# Patient Record
Sex: Female | Born: 1970 | Race: Asian | Hispanic: No | Smoking: Never smoker
Health system: Southern US, Community
[De-identification: ages and names within clinical notes are randomized; demographics above are authoritative.]

## PROBLEM LIST (undated history)

## (undated) ENCOUNTER — Inpatient Hospital Stay (HOSPITAL_COMMUNITY): Payer: Self-pay

## (undated) DIAGNOSIS — O09529 Supervision of elderly multigravida, unspecified trimester: Secondary | ICD-10-CM

## (undated) DIAGNOSIS — L309 Dermatitis, unspecified: Secondary | ICD-10-CM

## (undated) HISTORY — DX: Dermatitis, unspecified: L30.9

## (undated) HISTORY — PX: NO PAST SURGERIES: SHX2092

## (undated) HISTORY — DX: Supervision of elderly multigravida, unspecified trimester: O09.529

---

## 1997-08-01 ENCOUNTER — Inpatient Hospital Stay (HOSPITAL_COMMUNITY): Admission: AD | Admit: 1997-08-01 | Discharge: 1997-08-01 | Payer: Self-pay | Admitting: Obstetrics and Gynecology

## 1997-08-08 ENCOUNTER — Inpatient Hospital Stay (HOSPITAL_COMMUNITY): Admission: AD | Admit: 1997-08-08 | Discharge: 1997-08-10 | Payer: Self-pay | Admitting: Obstetrics & Gynecology

## 1998-09-23 ENCOUNTER — Other Ambulatory Visit: Admission: RE | Admit: 1998-09-23 | Discharge: 1998-09-23 | Payer: Self-pay | Admitting: Obstetrics and Gynecology

## 2000-02-24 ENCOUNTER — Other Ambulatory Visit: Admission: RE | Admit: 2000-02-24 | Discharge: 2000-02-24 | Payer: Self-pay | Admitting: Obstetrics and Gynecology

## 2000-03-25 ENCOUNTER — Other Ambulatory Visit: Admission: RE | Admit: 2000-03-25 | Discharge: 2000-03-25 | Payer: Self-pay | Admitting: Obstetrics and Gynecology

## 2000-03-25 ENCOUNTER — Encounter (INDEPENDENT_AMBULATORY_CARE_PROVIDER_SITE_OTHER): Payer: Self-pay | Admitting: Specialist

## 2000-09-20 ENCOUNTER — Other Ambulatory Visit: Admission: RE | Admit: 2000-09-20 | Discharge: 2000-09-20 | Payer: Self-pay | Admitting: Obstetrics and Gynecology

## 2001-05-13 ENCOUNTER — Other Ambulatory Visit: Admission: RE | Admit: 2001-05-13 | Discharge: 2001-05-13 | Payer: Self-pay | Admitting: Obstetrics and Gynecology

## 2002-08-21 ENCOUNTER — Other Ambulatory Visit: Admission: RE | Admit: 2002-08-21 | Discharge: 2002-08-21 | Payer: Self-pay | Admitting: Obstetrics and Gynecology

## 2003-03-06 ENCOUNTER — Other Ambulatory Visit: Admission: RE | Admit: 2003-03-06 | Discharge: 2003-03-06 | Payer: Self-pay | Admitting: Obstetrics and Gynecology

## 2003-09-05 ENCOUNTER — Inpatient Hospital Stay (HOSPITAL_COMMUNITY): Admission: AD | Admit: 2003-09-05 | Discharge: 2003-09-07 | Payer: Self-pay | Admitting: Obstetrics and Gynecology

## 2003-10-10 ENCOUNTER — Other Ambulatory Visit: Admission: RE | Admit: 2003-10-10 | Discharge: 2003-10-10 | Payer: Self-pay | Admitting: Obstetrics and Gynecology

## 2005-09-23 ENCOUNTER — Other Ambulatory Visit: Admission: RE | Admit: 2005-09-23 | Discharge: 2005-09-23 | Payer: Self-pay | Admitting: Obstetrics and Gynecology

## 2006-10-29 ENCOUNTER — Other Ambulatory Visit: Admission: RE | Admit: 2006-10-29 | Discharge: 2006-10-29 | Payer: Self-pay | Admitting: Family Medicine

## 2007-11-10 ENCOUNTER — Other Ambulatory Visit: Admission: RE | Admit: 2007-11-10 | Discharge: 2007-11-10 | Payer: Self-pay | Admitting: Family Medicine

## 2008-11-21 ENCOUNTER — Other Ambulatory Visit: Admission: RE | Admit: 2008-11-21 | Discharge: 2008-11-21 | Payer: Self-pay | Admitting: Family Medicine

## 2010-04-16 ENCOUNTER — Other Ambulatory Visit: Admission: RE | Admit: 2010-04-16 | Discharge: 2010-04-16 | Payer: Self-pay | Admitting: Family Medicine

## 2011-10-21 ENCOUNTER — Other Ambulatory Visit (HOSPITAL_COMMUNITY)
Admission: RE | Admit: 2011-10-21 | Discharge: 2011-10-21 | Disposition: A | Discharge status: Home / Self Care | Payer: BC Managed Care – PPO | Source: Ambulatory Visit | Attending: Family Medicine | Admitting: Family Medicine

## 2011-10-21 ENCOUNTER — Other Ambulatory Visit: Payer: Self-pay | Admitting: Family Medicine

## 2011-10-21 DIAGNOSIS — Z1159 Encounter for screening for other viral diseases: Secondary | ICD-10-CM | POA: Insufficient documentation

## 2011-10-21 DIAGNOSIS — Z124 Encounter for screening for malignant neoplasm of cervix: Secondary | ICD-10-CM | POA: Insufficient documentation

## 2012-07-05 ENCOUNTER — Encounter: Payer: Self-pay | Admitting: General Practice

## 2012-07-11 ENCOUNTER — Encounter: Payer: Self-pay | Admitting: *Deleted

## 2012-07-26 ENCOUNTER — Inpatient Hospital Stay (HOSPITAL_COMMUNITY): Payer: BC Managed Care – PPO

## 2012-07-26 ENCOUNTER — Inpatient Hospital Stay (HOSPITAL_COMMUNITY)
Admission: AD | Admit: 2012-07-26 | Discharge: 2012-07-26 | Disposition: A | Discharge status: Home / Self Care | Payer: BC Managed Care – PPO | Source: Ambulatory Visit | Attending: Obstetrics & Gynecology | Admitting: Obstetrics & Gynecology

## 2012-07-26 ENCOUNTER — Encounter (HOSPITAL_COMMUNITY): Payer: Self-pay

## 2012-07-26 DIAGNOSIS — O209 Hemorrhage in early pregnancy, unspecified: Secondary | ICD-10-CM | POA: Insufficient documentation

## 2012-07-26 DIAGNOSIS — N930 Postcoital and contact bleeding: Secondary | ICD-10-CM

## 2012-07-26 DIAGNOSIS — O4690 Antepartum hemorrhage, unspecified, unspecified trimester: Secondary | ICD-10-CM

## 2012-07-26 LAB — ABO/RH: ABO/RH(D): A POS

## 2012-07-26 LAB — WET PREP, GENITAL
Trich, Wet Prep: NONE SEEN
Yeast Wet Prep HPF POC: NONE SEEN

## 2012-07-26 LAB — CBC
HCT: 32.9 % — ABNORMAL LOW (ref 36.0–46.0)
Hemoglobin: 11.6 g/dL — ABNORMAL LOW (ref 12.0–15.0)
MCV: 76 fL — ABNORMAL LOW (ref 78.0–100.0)
WBC: 9.4 10*3/uL (ref 4.0–10.5)

## 2012-07-26 NOTE — MAU Provider Note (Signed)
Attestation of Attending Supervision of Advanced Practitioner (CNM/NP): Evaluation and management procedures were performed by the Advanced Practitioner under my supervision and collaboration.  I have reviewed the Advanced Practitioner's note and chart, and I agree with the management and plan.  [name], [name] 3:05 PM

## 2012-07-26 NOTE — MAU Note (Signed)
Patient is in with c/o vaginal bleeding that increases after intercourse. She denies any pain. Her ist OB appt is 08/03/12. fht 165-172. She is unsure of her LMP.

## 2012-07-26 NOTE — MAU Provider Note (Signed)
History     CSN: [id number]  Arrival date and time: 07/26/12 6295   None     Chief Complaint  Patient presents with  . Vaginal Bleeding   HPI Jordan Cabrera is a 42 y.o. female who presents to MAU with vaginal bleeding. The bleeding started last night about 11 pm. She describes the bleeding as lighter than a period. Noted the bleeding just after sexual intercourse. Then noted once more after going to the toilet. None since then. She denies any other problems.  First prenatal visit is scheduled for 08/03/12. The history was provided by the patient.  OB History   Grav Para Term Preterm Abortions TAB SAB Ect Mult Living   3 3 3   0 0 0 0 0 3      Past Medical History  Diagnosis Date  . Eczema     No past surgical history on file.  No family history on file.  History  Substance Use Topics  . Smoking status: Not on file  . Smokeless tobacco: Not on file  . Alcohol Use:     Allergies: Allergies no known allergies  No prescriptions prior to admission    Review of Systems  Constitutional: Negative for fever, chills and weight loss.  HENT: Negative for ear pain, nosebleeds, congestion, sore throat and neck pain.   Eyes: Negative for blurred vision, double vision, photophobia and pain.  Respiratory: Negative for cough, shortness of breath and wheezing.   Cardiovascular: Negative for chest pain, palpitations and leg swelling.  Gastrointestinal: Positive for nausea. Negative for heartburn, vomiting, abdominal pain, diarrhea and constipation.  Genitourinary: Negative for dysuria, urgency and frequency.       Vaginal bleeding  Musculoskeletal: Negative for myalgias and back pain.  Skin: Negative for itching and rash.  Neurological: Negative for dizziness, sensory change, speech change, seizures, weakness and headaches.  Endo/Heme/Allergies: Does not bruise/bleed easily.  Psychiatric/Behavioral: Negative for depression. The patient is not nervous/anxious.    Blood pressure 119/70,  pulse 77, temperature 98 F (36.7 C), temperature source Oral, resp. rate 16, height 5\' 4"  (1.626 m), weight 127 lb (57.607 kg).  Physical Exam  Nursing note and vitals reviewed. Constitutional: She is oriented to person, place, and time. She appears well-developed and well-nourished. No distress.  HENT:  Head: Normocephalic and atraumatic.  Eyes: EOM are normal.  Neck: Neck supple.  Cardiovascular: Normal rate.   Respiratory: Effort normal.  GI: Soft. There is no tenderness.  Genitourinary:  External genitalia without lesions. Scant brown discharge vaginal vault. No active bleeding noted. Cervix long, closed, no CMT, no adnexal tenderness. Uterus approximately 10 week size.   Musculoskeletal: Normal range of motion.  Neurological: She is alert and oriented to person, place, and time.  Skin: Skin is warm and dry.  Psychiatric: She has a normal mood and affect. Her behavior is normal. Judgment and thought content normal.   Results for orders placed during the hospital encounter of 07/26/12 (from the past 24 hour(s))  WET PREP, GENITAL     Status: Abnormal   Collection Time    07/26/12 10:36 AM      Result Value Range   Yeast Wet Prep HPF POC NONE SEEN  NONE SEEN   Trich, Wet Prep NONE SEEN  NONE SEEN   Clue Cells Wet Prep HPF POC NONE SEEN  NONE SEEN   WBC, Wet Prep HPF POC FEW (*) NONE SEEN  ABO/RH     Status: None   Collection Time  07/26/12 10:40 AM      Result Value Range   ABO/RH(D) A POS    CBC     Status: Abnormal   Collection Time    07/26/12 10:40 AM      Result Value Range   WBC 9.4  4.0 - 10.5 K/uL   RBC 4.33  3.87 - 5.11 MIL/uL   Hemoglobin 11.6 (*) 12.0 - 15.0 g/dL   HCT 16.1 (*) 09.6 - 04.5 %   MCV 76.0 (*) 78.0 - 100.0 fL   MCH 26.8  26.0 - 34.0 pg   MCHC 35.3  30.0 - 36.0 g/dL   RDW 40.9  81.1 - 91.4 %   Platelets 291  150 - 400 K/uL    Procedures   Assessment: 42 y.o. female @ 10 weeks 2 days gestation with vaginal bleeding   Postcoital  bleeding  Plan:  Keep appointment next week to start prenatal care.   Pelvic rest, Return here as needed I have reviewed this patient's vital signs, nurses notes, appropriate labs and imaging.  I have discussed results with the patient and plan of care. Patient voices understanding.    Medication List    TAKE these medications       prenatal multivitamin Tabs  Take 1 tablet by mouth daily.         Roshaun Pound, RN, FNP, [facility] 07/26/2012, 10:10 AM

## 2012-08-03 ENCOUNTER — Encounter: Payer: Self-pay | Admitting: Obstetrics and Gynecology

## 2012-08-03 ENCOUNTER — Ambulatory Visit (INDEPENDENT_AMBULATORY_CARE_PROVIDER_SITE_OTHER): Payer: BC Managed Care – PPO | Admitting: Obstetrics and Gynecology

## 2012-08-03 VITALS — BP 114/72 | Temp 98.4°F | Wt 125.8 lb

## 2012-08-03 DIAGNOSIS — O09529 Supervision of elderly multigravida, unspecified trimester: Secondary | ICD-10-CM

## 2012-08-03 DIAGNOSIS — O09892 Supervision of other high risk pregnancies, second trimester: Secondary | ICD-10-CM

## 2012-08-03 DIAGNOSIS — O09899 Supervision of other high risk pregnancies, unspecified trimester: Secondary | ICD-10-CM | POA: Insufficient documentation

## 2012-08-03 DIAGNOSIS — O09522 Supervision of elderly multigravida, second trimester: Secondary | ICD-10-CM

## 2012-08-03 DIAGNOSIS — O09219 Supervision of pregnancy with history of pre-term labor, unspecified trimester: Secondary | ICD-10-CM

## 2012-08-03 LAB — POCT URINALYSIS DIP (DEVICE)
Bilirubin Urine: NEGATIVE
Ketones, ur: NEGATIVE mg/dL
Specific Gravity, Urine: <=1.005 (ref 1.005–1.030)
pH: 5.5 (ref 5.0–8.0)

## 2012-08-03 MED ORDER — DOXYLAMINE-PYRIDOXINE 10-10 MG PO TBEC: 1.0000 | DELAYED_RELEASE_TABLET | Freq: Two times a day (BID) | ORAL | Status: DC

## 2012-08-03 NOTE — Progress Notes (Signed)
Pulse- 87 Patient reports maybe feeling a slight "flutter" from baby

## 2012-08-03 NOTE — Patient Instructions (Addendum)
ABCs of Pregnancy  A  Antepartum care is very important. Be sure you see your doctor and get prenatal care as soon as you think you are pregnant. At this time, you will be tested for infection, genetic abnormalities and potential problems with you and the pregnancy. This is the time to discuss diet, exercise, work, medications, labor, pain medication during labor and the possibility of a cesarean delivery. Ask any questions that may concern you. It is important to see your doctor regularly throughout your pregnancy. Avoid exposure to toxic substances and chemicals - such as cleaning solvents, lead and mercury, some insecticides, and paint. Pregnant women should avoid exposure to paint fumes, and fumes that cause you to feel ill, dizzy or faint. When possible, it is a good idea to have a pre-pregnancy consultation with your caregiver to begin some important recommendations your caregiver suggests such as, taking folic acid, exercising, quitting smoking, avoiding alcoholic beverages, etc.  B  Breastfeeding is the healthiest choice for both you and your baby. It has many nutritional benefits for the baby and health benefits for the mother. It also creates a very tight and loving bond between the baby and mother. Talk to your doctor, your family and friends, and your employer about how you choose to feed your baby and how they can support you in your decision. Not all birth defects can be prevented, but a woman can take actions that may increase her chance of having a healthy baby. Many birth defects happen very early in pregnancy, sometimes before a woman even knows she is pregnant. Birth defects or abnormalities of any child in your or the father's family should be discussed with your caregiver. Get a good support bra as your breast size changes. Wear it especially when you exercise and when nursing.   C  Celebrate the news of your pregnancy with the your spouse/father and family. Childbirth classes are helpful to  take for you and the spouse/father because it helps to understand what happens during the pregnancy, labor and delivery. Cesarean delivery should be discussed with your doctor so you are prepared for that possibility. The pros and cons of circumcision if it is a boy, should be discussed with your pediatrician. Cigarette smoking during pregnancy can result in low birth weight babies. It has been associated with infertility, miscarriages, tubal pregnancies, infant death (mortality) and poor health (morbidity) in childhood. Additionally, cigarette smoking may cause long-term learning disabilities. If you smoke, you should try to quit before getting pregnant and not smoke during the pregnancy. Secondary smoke may also harm a mother and her developing baby. It is a good idea to ask people to stop smoking around you during your pregnancy and after the baby is born. Extra calcium is necessary when you are pregnant and is found in your prenatal vitamin, in dairy products, green leafy vegetables and in calcium supplements.  D  A healthy diet according to your current weight and height, along with vitamins and mineral supplements should be discussed with your caregiver. Domestic abuse or violence should be made known to your doctor right away to get the situation corrected. Drink more water when you exercise to keep hydrated. Discomfort of your back and legs usually develops and progresses from the middle of the second trimester through to delivery of the baby. This is because of the enlarging baby and uterus, which may also affect your balance. Do not take illegal drugs. Illegal drugs can seriously harm the baby and you. Drink extra   fluids (water is best) throughout pregnancy to help your body keep up with the increases in your blood volume. Drink at least 6 to 8 glasses of water, fruit juice, or milk each day. A good way to know you are drinking enough fluid is when your urine looks almost like clear water or is very light  yellow.   E  Eat healthy to get the nutrients you and your unborn baby need. Your meals should include the five basic food groups. Exercise (30 minutes of light to moderate exercise a day) is important and encouraged during pregnancy, if there are no medical problems or problems with the pregnancy. Exercise that causes discomfort or dizziness should be stopped and reported to your caregiver. Emotions during pregnancy can change from being ecstatic to depression and should be understood by you, your partner and your family.  F  Fetal screening with ultrasound, amniocentesis and monitoring during pregnancy and labor is common and sometimes necessary. Take 400 micrograms of folic acid daily both before, when possible, and during the first few months of pregnancy to reduce the risk of birth defects of the brain and spine. All women who could possibly become pregnant should take a vitamin with folic acid, every day. It is also important to eat a healthy diet with fortified foods (enriched grain products, including cereals, rice, breads, and pastas) and foods with natural sources of folate (orange juice, green leafy vegetables, beans, peanuts, broccoli, asparagus, peas, and lentils). The father should be involved with all aspects of the pregnancy including, the prenatal care, childbirth classes, labor, delivery, and postpartum time. Fathers may also have emotional concerns about being a father, financial needs, and raising a family.  G  Genetic testing should be done appropriately. It is important to know your family and the father's history. If there have been problems with pregnancies or birth defects in your family, report these to your doctor. Also, genetic counselors can talk with you about the information you might need in making decisions about having a family. You can call a major medical center in your area for help in finding a board-certified genetic counselor. Genetic testing and counseling should be done  before pregnancy when possible, especially if there is a history of problems in the mother's or father's family. Certain ethnic backgrounds are more at risk for genetic defects.  H  Get familiar with the hospital where you will be having your baby. Get to know how long it takes to get there, the labor and delivery area, and the hospital procedures. Be sure your medical insurance is accepted there. Get your home ready for the baby including, clothes, the baby's room (when possible), furniture and car seat. Hand washing is important throughout the day, especially after handling raw meat and poultry, changing the baby's diaper or using the bathroom. This can help prevent the spread of many bacteria and viruses that cause infection. Your hair may become dry and thinner, but will return to normal a few weeks after the baby is born. Heartburn is a common problem that can be treated by taking antacids recommended by your caregiver, eating smaller meals 5 or 6 times a day, not drinking liquids when eating, drinking between meals and raising the head of your bed 2 to 3 inches.  I  Insurance to cover you, the baby, doctor and hospital should be reviewed so that you will be prepared to pay any costs not covered by your insurance plan. If you do not have medical insurance,   there are usually clinics and services available for you in your community. Take 30 milligrams of iron during your pregnancy as prescribed by your doctor to reduce the risk of low red blood cells (anemia) later in pregnancy. All women of childbearing age should eat a diet rich in iron.  J  There should be a joint effort for the mother, father and any other children to adapt to the pregnancy financially, emotionally, and psychologically during the pregnancy. Join a support group for moms-to-be. Or, join a class on parenting or childbirth. Have the family participate when possible.  K  Know your limits. Let your caregiver know if you experience any of the  following:   · Pain of any kind.  · Strong cramps.  · You develop a lot of weight in a short period of time (5 pounds in 3 to 5 days).  · Vaginal bleeding, leaking of amniotic fluid.  · Headache, vision problems.  · Dizziness, fainting, shortness of breath.  · Chest pain.  · Fever of 102° F (38.9° C) or higher.  · Gush of clear fluid from your vagina.  · Painful urination.  · Domestic violence.  · Irregular heartbeat (palpitations).  · Rapid beating of the heart (tachycardia).  · Constant feeling sick to your stomach (nauseous) and vomiting.  · Trouble walking, fluid retention (edema).  · Muscle weakness.  · If your baby has decreased activity.  · Persistent diarrhea.  · Abnormal vaginal discharge.  · Uterine contractions at 20-minute intervals.  · Back pain that travels down your leg.  L  Learn and practice that what you eat and drink should be in moderation and healthy for you and your baby. Legal drugs such as alcohol and caffeine are important issues for pregnant women. There is no safe amount of alcohol a woman can drink while pregnant. Fetal alcohol syndrome, a disorder characterized by growth retardation, facial abnormalities, and central nervous system dysfunction, is caused by a woman's use of alcohol during pregnancy. Caffeine, found in tea, coffee, soft drinks and chocolate, should also be limited. Be sure to read labels when trying to cut down on caffeine during pregnancy. More than 200 foods, beverages, and over-the-counter medications contain caffeine and have a high salt content! There are coffees and teas that do not contain caffeine.  M  Medical conditions such as diabetes, epilepsy, and high blood pressure should be treated and kept under control before pregnancy when possible, but especially during pregnancy. Ask your caregiver about any medications that may need to be changed or adjusted during pregnancy. If you are currently taking any medications, ask your caregiver if it is safe to take them  while you are pregnant or before getting pregnant when possible. Also, be sure to discuss any herbs or vitamins you are taking. They are medicines, too! Discuss with your doctor all medications, prescribed and over-the-counter, that you are taking. During your prenatal visit, discuss the medications your doctor may give you during labor and delivery.  N  Never be afraid to ask your doctor or caregiver questions about your health, the progress of the pregnancy, family problems, stressful situations, and recommendation for a pediatrician, if you do not have one. It is better to take all precautions and discuss any questions or concerns you may have during your office visits. It is a good idea to write down your questions before you visit the doctor.  O  Over-the-counter cough and cold remedies may contain alcohol or other ingredients that should   be avoided during pregnancy. Ask your caregiver about prescription, herbs or over-the-counter medications that you are taking or may consider taking while pregnant.   P  Physical activity during pregnancy can benefit both you and your baby by lessening discomfort and fatigue, providing a sense of well-being, and increasing the likelihood of early recovery after delivery. Light to moderate exercise during pregnancy strengthens the belly (abdominal) and back muscles. This helps improve posture. Practicing yoga, walking, swimming, and cycling on a stationary bicycle are usually safe exercises for pregnant women. Avoid scuba diving, exercise at high altitudes (over 3000 feet), skiing, horseback riding, contact sports, etc. Always check with your doctor before beginning any kind of exercise, especially during pregnancy and especially if you did not exercise before getting pregnant.  Q  Queasiness, stomach upset and morning sickness are common during pregnancy. Eating a couple of crackers or dry toast before getting out of bed. Foods that you normally love may make you feel sick to  your stomach. You may need to substitute other nutritious foods. Eating 5 or 6 small meals a day instead of 3 large ones may make you feel better. Do not drink with your meals, drink between meals. Questions that you have should be written down and asked during your prenatal visits.  R  Read about and make plans to baby-proof your home. There are important tips for making your home a safer environment for your baby. Review the tips and make your home safer for you and your baby. Read food labels regarding calories, salt and fat content in the food.  S  Saunas, hot tubs, and steam rooms should be avoided while you are pregnant. Excessive high heat may be harmful during your pregnancy. Your caregiver will screen and examine you for sexually transmitted diseases and genetic disorders during your prenatal visits. Learn the signs of labor. Sexual relations while pregnant is safe unless there is a medical or pregnancy problem and your caregiver advises against it.  T  Traveling long distances should be avoided especially in the third trimester of your pregnancy. If you do have to travel out of state, be sure to take a copy of your medical records and medical insurance plan with you. You should not travel long distances without seeing your doctor first. Most airlines will not allow you to travel after 36 weeks of pregnancy. Toxoplasmosis is an infection caused by a parasite that can seriously harm an unborn baby. Avoid eating undercooked meat and handling cat litter. Be sure to wear gloves when gardening. Tingling of the hands and fingers is not unusual and is due to fluid retention. This will go away after the baby is born.  U  Womb (uterus) size increases during the first trimester. Your kidneys will begin to function more efficiently. This may cause you to feel the need to urinate more often. You may also leak urine when sneezing, coughing or laughing. This is due to the growing uterus pressing against your bladder,  which lies directly in front of and slightly under the uterus during the first few months of pregnancy. If you experience burning along with frequency of urination or bloody urine, be sure to tell your doctor. The size of your uterus in the third trimester may cause a problem with your balance. It is advisable to maintain good posture and avoid wearing high heels during this time. An ultrasound of your baby may be necessary during your pregnancy and is safe for you and your baby.  V    Vaccinations are an important concern for pregnant women. Get needed vaccines before pregnancy. Center for Disease Control (www.cdc.gov) has clear guidelines for the use of vaccines during pregnancy. Review the list, be sure to discuss it with your doctor. Prenatal vitamins are helpful and healthy for you and the baby. Do not take extra vitamins except what is recommended. Taking too much of certain vitamins can cause overdose problems. Continuous vomiting should be reported to your caregiver. Varicose veins may appear especially if there is a family history of varicose veins. They should subside after the delivery of the baby. Support hose helps if there is leg discomfort.  W  Being overweight or underweight during pregnancy may cause problems. Try to get within 15 pounds of your ideal weight before pregnancy. Remember, pregnancy is not a time to be dieting! Do not stop eating or start skipping meals as your weight increases. Both you and your baby need the calories and nutrition you receive from a healthy diet. Be sure to consult with your doctor about your diet. There is a formula and diet plan available depending on whether you are overweight or underweight. Your caregiver or nutritionist can help and advise you if necessary.  X  Avoid X-rays. If you must have dental work or diagnostic tests, tell your dentist or physician that you are pregnant so that extra care can be taken. X-rays should only be taken when the risks of not taking  them outweigh the risk of taking them. If needed, only the minimum amount of radiation should be used. When X-rays are necessary, protective lead shields should be used to cover areas of the body that are not being X-rayed.  Y  Your baby loves you. Breastfeeding your baby creates a loving and very close bond between the two of you. Give your baby a healthy environment to live in while you are pregnant. Infants and children require constant care and guidance. Their health and safety should be carefully watched at all times. After the baby is born, rest or take a nap when the baby is sleeping.  Z  Get your ZZZs. Be sure to get plenty of rest. Resting on your side as often as possible, especially on your left side is advised. It provides the best circulation to your baby and helps reduce swelling. Try taking a nap for 30 to 45 minutes in the afternoon when possible. After the baby is born rest or take a nap when the baby is sleeping. Try elevating your feet for that amount of time when possible. It helps the circulation in your legs and helps reduce swelling.   Most information courtesy of the CDC.  Document Released: 05/18/2005 Document Revised: 08/10/2011 Document Reviewed: 01/30/2009  [employer]® Patient Information ©2013 [employer].

## 2012-08-04 NOTE — Progress Notes (Signed)
   Subjective:    Jordan Cabrera is a [id number] [redacted]w[redacted]d being seen today for her first obstetrical visit.  Her obstetrical history is significant for advanced maternal age and late preterm birth x2. Patient does intend to breast feed. Pregnancy history fully reviewed.  Patient reports nausea.  Filed Vitals:   08/03/12 0858  BP: 114/72  Temp: 98.4 F (36.9 C)  Weight: 57.063 kg (125 lb 12.8 oz)    HISTORY: OB History   Grav Para Term Preterm Abortions TAB SAB Ect Mult Living   4 3 1 2  0 0 0 0 0 3     # Outc Date GA Lbr Len/2nd Wgt Sex Del Anes PTL Lv   1 PRE 10/96 [redacted]w[redacted]d  2.722kg(6lb) F SVD None Yes Yes   Comments: water broke at 35w   2 TRM 3/99 [redacted]w[redacted]d  3.09kg(6lb13oz) M SVD EPI No Yes   3 PRE 4/05 [redacted]w[redacted]d  3.118kg(6lb14oz) F SVD EPI  Yes   Comments: went into labor early    4 CUR              Past Medical History  Diagnosis Date  . Eczema    History reviewed. No pertinent past surgical history. History reviewed. No pertinent family history.   Exam    Uterus:     Pelvic Exam:    Perineum: No Hemorrhoids   Vulva: normal   Vagina:  normal mucosa, normal discharge       Cervix: L/C   Adnexa: not evaluated   Bony Pelvis: gynecoid  System: Breast:  normal appearance, no masses or tenderness   Skin: normal coloration and turgor, no rashes    Neurologic: oriented, normal, grossly non-focal, normal mood   Extremities: No edema   HEENT PERRLA   Mouth/Teeth mucous membranes moist, pharynx normal without lesions   Neck supple and no masses   Cardiovascular: regular rate and rhythm   Respiratory:  appears well, vitals normal, no respiratory distress, acyanotic, normal RR, ear and throat exam is normal, neck free of mass or lymphadenopathy, chest clear, no wheezing, crepitations, rhonchi, normal symmetric air entry   Abdomen: soft NT FHR 166   Urinary: urethral meatus normal      Assessment:    Pregnancy: [id number] Patient Active Problem List  Diagnosis  . History of preterm  delivery, currently pregnant  . AMA (advanced maternal age) multigravida 35+        Plan:     Initial labs drawn. Prenatal vitamins. Problem list reviewed and updated. Genetic Screening discussed First Screen: requested.  Ultrasound discussed; fetal survey: requested.  Follow up in 4 weeks. 50% of 30 min visit spent on counseling and coordination of care. Call if antiemetic needed    POE,DEIRDRE 08/04/2012

## 2012-08-05 LAB — CULTURE, OB URINE

## 2012-08-10 ENCOUNTER — Encounter: Payer: Self-pay | Admitting: *Deleted

## 2012-08-15 ENCOUNTER — Other Ambulatory Visit: Payer: Self-pay | Admitting: Family Medicine

## 2012-08-15 DIAGNOSIS — Z3682 Encounter for antenatal screening for nuchal translucency: Secondary | ICD-10-CM

## 2012-08-16 ENCOUNTER — Ambulatory Visit (HOSPITAL_COMMUNITY): Payer: BC Managed Care – PPO | Attending: Family Medicine

## 2012-08-16 ENCOUNTER — Ambulatory Visit (HOSPITAL_COMMUNITY): Payer: BC Managed Care – PPO

## 2012-08-19 LAB — OB RESULTS CONSOLE GC/CHLAMYDIA
Chlamydia: NEGATIVE
Gonorrhea: NEGATIVE

## 2012-08-19 LAB — OB RESULTS CONSOLE HEPATITIS B SURFACE ANTIGEN: Hepatitis B Surface Ag: NEGATIVE

## 2012-08-19 LAB — OB RESULTS CONSOLE ANTIBODY SCREEN: Antibody Screen: NEGATIVE

## 2012-08-31 ENCOUNTER — Encounter: Payer: BC Managed Care – PPO | Admitting: Family Medicine

## 2013-01-18 LAB — OB RESULTS CONSOLE GBS: GBS: NEGATIVE

## 2013-02-02 ENCOUNTER — Inpatient Hospital Stay (HOSPITAL_COMMUNITY)
Admission: AD | Admit: 2013-02-02 | Discharge: 2013-02-02 | Disposition: A | Discharge status: Home / Self Care | Payer: BC Managed Care – PPO | Source: Ambulatory Visit | Attending: Obstetrics and Gynecology | Admitting: Obstetrics and Gynecology

## 2013-02-02 ENCOUNTER — Encounter (HOSPITAL_COMMUNITY): Payer: Self-pay | Admitting: *Deleted

## 2013-02-02 DIAGNOSIS — O4690 Antepartum hemorrhage, unspecified, unspecified trimester: Secondary | ICD-10-CM | POA: Insufficient documentation

## 2013-02-02 DIAGNOSIS — O479 False labor, unspecified: Secondary | ICD-10-CM | POA: Insufficient documentation

## 2013-02-02 LAB — POCT FERN TEST: POCT Fern Test: NEGATIVE

## 2013-02-02 NOTE — MAU Provider Note (Signed)
Jordan Cabrera is a 42 y.o. [phone] at [redacted]w[redacted]d who presents to MAU today with vaginal bleeding. The patient states that she was checked in the office today and was 3 cm dilated. She states occasional contractions and possible LOF.   BP 109/61  Pulse 76  Temp(Src) 97.9 F (36.6 C) (Oral)  Resp 16  Ht 5\' 4"  (1.626 m)  Wt 146 lb (66.225 kg)  BMI 25.05 kg/m2  SpO2 100%  LMP 04/28/2012 GENERAL: Well-developed, well-nourished female in no acute distress.  HEENT: Normocephalic, atraumatic.   LUNGS: Effort normal HEART: Regular rate  SKIN: Warm, dry and without erythema PSYCH: Normal mood and affect Dilation: 3 Effacement (%): 50 Station: -3 Presentation: Vertex Exam by:: J.Either,PA  Negative- pooling Negative - Fer[place]  Report given to RN to contact MD for further instructions.   Freddi Starr, PA-C 02/02/2013 8:26 PM

## 2013-02-02 NOTE — MAU Note (Addendum)
Patient reports irregular contractions for the past week but today it has been worse. Patient reports vaginal bleeding increased today after her cervical exam. Patient reports good fetal movement and no lof. Patient reports she was checked today and was 3cm.

## 2013-02-10 ENCOUNTER — Encounter (HOSPITAL_COMMUNITY): Payer: Self-pay | Admitting: *Deleted

## 2013-02-10 ENCOUNTER — Telehealth (HOSPITAL_COMMUNITY): Payer: Self-pay | Admitting: *Deleted

## 2013-02-10 NOTE — Telephone Encounter (Signed)
Preadmission screen  

## 2013-02-13 ENCOUNTER — Inpatient Hospital Stay (HOSPITAL_COMMUNITY): Payer: BC Managed Care – PPO | Admitting: Anesthesiology

## 2013-02-13 ENCOUNTER — Encounter (HOSPITAL_COMMUNITY): Payer: Self-pay | Admitting: Anesthesiology

## 2013-02-13 ENCOUNTER — Inpatient Hospital Stay (HOSPITAL_COMMUNITY)
Admission: RE | Admit: 2013-02-13 | Discharge: 2013-02-15 | DRG: 373 | Disposition: A | Discharge status: Home / Self Care | Payer: BC Managed Care – PPO | Source: Ambulatory Visit | Attending: Obstetrics and Gynecology | Admitting: Obstetrics and Gynecology

## 2013-02-13 ENCOUNTER — Encounter (HOSPITAL_COMMUNITY): Payer: Self-pay

## 2013-02-13 DIAGNOSIS — O09529 Supervision of elderly multigravida, unspecified trimester: Principal | ICD-10-CM | POA: Diagnosis present

## 2013-02-13 LAB — TYPE AND SCREEN
ABO/RH(D): A POS
Antibody Screen: NEGATIVE

## 2013-02-13 LAB — CBC
HCT: 34.6 % — ABNORMAL LOW (ref 36.0–46.0)
Hemoglobin: 12.3 g/dL (ref 12.0–15.0)
MCH: 27 pg (ref 26.0–34.0)
MCHC: 35.5 g/dL (ref 30.0–36.0)
MCV: 75.9 fL — ABNORMAL LOW (ref 78.0–100.0)
Platelets: 309 K/uL (ref 150–400)
RBC: 4.56 MIL/uL (ref 3.87–5.11)
RDW: 15 % (ref 11.5–15.5)
WBC: 11.4 K/uL — ABNORMAL HIGH (ref 4.0–10.5)

## 2013-02-13 LAB — RPR: RPR Ser Ql: NONREACTIVE

## 2013-02-13 MED ORDER — EPHEDRINE 5 MG/ML INJ
10.0000 mg | INTRAVENOUS | Status: DC | PRN
  Filled 2013-02-13: qty 2

## 2013-02-13 MED ORDER — FLEET ENEMA 7-19 GM/118ML RE ENEM: 1.0000 | ENEMA | Freq: Every day | RECTAL | Status: DC | PRN

## 2013-02-13 MED ORDER — WITCH HAZEL-GLYCERIN EX PADS: 1.0000 "application " | MEDICATED_PAD | CUTANEOUS | Status: DC | PRN

## 2013-02-13 MED ORDER — SENNOSIDES-DOCUSATE SODIUM 8.6-50 MG PO TABS
2.0000 | ORAL_TABLET | ORAL | Status: DC
  Administered 2013-02-13 – 2013-02-14 (×2): 2 via ORAL

## 2013-02-13 MED ORDER — BISACODYL 10 MG RE SUPP: 10.0000 mg | Freq: Every day | RECTAL | Status: DC | PRN

## 2013-02-13 MED ORDER — OXYCODONE-ACETAMINOPHEN 5-325 MG PO TABS: 1.0000 | ORAL_TABLET | ORAL | Status: DC | PRN

## 2013-02-13 MED ORDER — LIDOCAINE HCL (PF) 1 % IJ SOLN
INTRAMUSCULAR | Status: DC | PRN
  Administered 2013-02-13 (×2): 5 mL

## 2013-02-13 MED ORDER — BENZOCAINE-MENTHOL 20-0.5 % EX AERO: 1.0000 "application " | INHALATION_SPRAY | CUTANEOUS | Status: DC | PRN

## 2013-02-13 MED ORDER — IBUPROFEN 600 MG PO TABS
600.0000 mg | ORAL_TABLET | Freq: Four times a day (QID) | ORAL | Status: DC
  Administered 2013-02-13 – 2013-02-15 (×6): 600 mg via ORAL
  Filled 2013-02-13 (×4): qty 1

## 2013-02-13 MED ORDER — LACTATED RINGERS IV SOLN: 500.0000 mL | Freq: Once | INTRAVENOUS | Status: DC

## 2013-02-13 MED ORDER — OXYTOCIN 40 UNITS IN LACTATED RINGERS INFUSION - SIMPLE MED: 62.5000 mL/h | INTRAVENOUS | Status: DC

## 2013-02-13 MED ORDER — INFLUENZA VAC SPLIT QUAD 0.5 ML IM SUSP
0.5000 mL | INTRAMUSCULAR | Status: AC
  Administered 2013-02-14: 0.5 mL via INTRAMUSCULAR
  Filled 2013-02-13: qty 0.5

## 2013-02-13 MED ORDER — FLEET ENEMA 7-19 GM/118ML RE ENEM: 1.0000 | ENEMA | RECTAL | Status: DC | PRN

## 2013-02-13 MED ORDER — CITRIC ACID-SODIUM CITRATE 334-500 MG/5ML PO SOLN: 30.0000 mL | ORAL | Status: DC | PRN

## 2013-02-13 MED ORDER — ZOLPIDEM TARTRATE 5 MG PO TABS
5.0000 mg | ORAL_TABLET | Freq: Every evening | ORAL | Status: DC | PRN
  Administered 2013-02-13 – 2013-02-14 (×2): 5 mg via ORAL
  Filled 2013-02-13 (×2): qty 1

## 2013-02-13 MED ORDER — ACETAMINOPHEN 325 MG PO TABS: 650.0000 mg | ORAL_TABLET | ORAL | Status: DC | PRN

## 2013-02-13 MED ORDER — PRENATAL MULTIVITAMIN CH
1.0000 | ORAL_TABLET | Freq: Every day | ORAL | Status: DC
  Administered 2013-02-14: 1 via ORAL
  Filled 2013-02-13: qty 1

## 2013-02-13 MED ORDER — SIMETHICONE 80 MG PO CHEW: 80.0000 mg | CHEWABLE_TABLET | ORAL | Status: DC | PRN

## 2013-02-13 MED ORDER — BUTORPHANOL TARTRATE 1 MG/ML IJ SOLN: 1.0000 mg | INTRAMUSCULAR | Status: DC | PRN

## 2013-02-13 MED ORDER — LACTATED RINGERS IV SOLN
INTRAVENOUS | Status: DC
  Administered 2013-02-13 (×2): via INTRAVENOUS

## 2013-02-13 MED ORDER — IBUPROFEN 600 MG PO TABS
600.0000 mg | ORAL_TABLET | Freq: Four times a day (QID) | ORAL | Status: DC | PRN
  Administered 2013-02-13: 600 mg via ORAL
  Filled 2013-02-13 (×3): qty 1

## 2013-02-13 MED ORDER — LIDOCAINE HCL (PF) 1 % IJ SOLN
30.0000 mL | INTRAMUSCULAR | Status: DC | PRN
  Filled 2013-02-13 (×2): qty 30

## 2013-02-13 MED ORDER — EPHEDRINE 5 MG/ML INJ
10.0000 mg | INTRAVENOUS | Status: DC | PRN
  Filled 2013-02-13: qty 4
  Filled 2013-02-13: qty 2

## 2013-02-13 MED ORDER — LACTATED RINGERS IV SOLN: 500.0000 mL | INTRAVENOUS | Status: DC | PRN

## 2013-02-13 MED ORDER — OXYTOCIN BOLUS FROM INFUSION: 500.0000 mL | INTRAVENOUS | Status: DC

## 2013-02-13 MED ORDER — LANOLIN HYDROUS EX OINT: TOPICAL_OINTMENT | CUTANEOUS | Status: DC | PRN

## 2013-02-13 MED ORDER — ONDANSETRON HCL 4 MG/2ML IJ SOLN: 4.0000 mg | INTRAMUSCULAR | Status: DC | PRN

## 2013-02-13 MED ORDER — DIPHENHYDRAMINE HCL 25 MG PO CAPS: 25.0000 mg | ORAL_CAPSULE | Freq: Four times a day (QID) | ORAL | Status: DC | PRN

## 2013-02-13 MED ORDER — FENTANYL 2.5 MCG/ML BUPIVACAINE 1/10 % EPIDURAL INFUSION (WH - ANES)
14.0000 mL/h | INTRAMUSCULAR | Status: DC | PRN
  Administered 2013-02-13: 14 mL/h via EPIDURAL
  Filled 2013-02-13: qty 125

## 2013-02-13 MED ORDER — TERBUTALINE SULFATE 1 MG/ML IJ SOLN: 0.2500 mg | Freq: Once | INTRAMUSCULAR | Status: DC | PRN

## 2013-02-13 MED ORDER — PHENYLEPHRINE 40 MCG/ML (10ML) SYRINGE FOR IV PUSH (FOR BLOOD PRESSURE SUPPORT)
80.0000 ug | PREFILLED_SYRINGE | INTRAVENOUS | Status: DC | PRN
  Filled 2013-02-13: qty 5
  Filled 2013-02-13: qty 2

## 2013-02-13 MED ORDER — PHENYLEPHRINE 40 MCG/ML (10ML) SYRINGE FOR IV PUSH (FOR BLOOD PRESSURE SUPPORT)
80.0000 ug | PREFILLED_SYRINGE | INTRAVENOUS | Status: DC | PRN
  Filled 2013-02-13: qty 2

## 2013-02-13 MED ORDER — DIPHENHYDRAMINE HCL 50 MG/ML IJ SOLN: 12.5000 mg | INTRAMUSCULAR | Status: DC | PRN

## 2013-02-13 MED ORDER — ONDANSETRON HCL 4 MG/2ML IJ SOLN: 4.0000 mg | Freq: Four times a day (QID) | INTRAMUSCULAR | Status: DC | PRN

## 2013-02-13 MED ORDER — OXYTOCIN 40 UNITS IN LACTATED RINGERS INFUSION - SIMPLE MED
1.0000 m[IU]/min | INTRAVENOUS | Status: DC
  Administered 2013-02-13: 1 m[IU]/min via INTRAVENOUS
  Filled 2013-02-13: qty 1000

## 2013-02-13 MED ORDER — ONDANSETRON HCL 4 MG PO TABS: 4.0000 mg | ORAL_TABLET | ORAL | Status: DC | PRN

## 2013-02-13 MED ORDER — TETANUS-DIPHTH-ACELL PERTUSSIS 5-2.5-18.5 LF-MCG/0.5 IM SUSP: 0.5000 mL | Freq: Once | INTRAMUSCULAR | Status: DC

## 2013-02-13 MED ORDER — DIBUCAINE 1 % RE OINT: 1.0000 "application " | TOPICAL_OINTMENT | RECTAL | Status: DC | PRN

## 2013-02-13 NOTE — Progress Notes (Signed)
FHT reactive UC q 2-3 min Cx = 5 cm per nurse check Epidural in

## 2013-02-13 NOTE — Anesthesia Preprocedure Evaluation (Signed)

## 2013-02-13 NOTE — Anesthesia Procedure Notes (Signed)
Epidural Patient location during procedure: OB Start time: 02/13/2013 9:13 AM  Staffing Anesthesiologist: Angus Seller., Harrell Gave. Performed by: anesthesiologist   Preanesthetic Checklist Completed: patient identified, site marked, surgical consent, pre-op evaluation, timeout performed, IV checked, risks and benefits discussed and monitors and equipment checked  Epidural Patient position: sitting Prep: site prepped and draped and DuraPrep Patient monitoring: continuous pulse ox and blood pressure Approach: midline Injection technique: LOR air  Needle:  Needle type: Tuohy  Needle gauge: 17 G Needle length: 9 cm and 9 Needle insertion depth: 5 cm cm Catheter type: closed end flexible Catheter size: 19 Gauge Catheter at skin depth: 10 cm Test dose: negative  Assessment Events: blood not aspirated, injection not painful, no injection resistance, negative IV test and no paresthesia  Additional Notes Patient identified.  Risk benefits discussed including failed block, incomplete pain control, headache, nerve damage, paralysis, blood pressure changes, nausea, vomiting, reactions to medication both toxic or allergic, and postpartum back pain.  Patient expressed understanding and wished to proceed.  All questions were answered.  Sterile technique used throughout procedure and epidural site dressed with sterile barrier dressing. No paresthesia or other complications noted.The patient did not experience any signs of intravascular injection such as tinnitus or metallic taste in mouth nor signs of intrathecal spread such as rapid motor block. Please see nursing notes for vital signs.

## 2013-02-13 NOTE — Lactation Note (Signed)
This note was copied from the chart of Jordan Cabrera. Lactation Consultation Note  Patient Name: Jordan Cabrera [id number] Date: 02/13/2013 Reason for consult: Initial assessment;Other (Comment) (charting for exclusion)   Maternal Data Formula Feeding for Exclusion: Yes Reason for exclusion: Mother's choice to forumla feed on admision  Feeding    LATCH Score/Interventions                      Lactation Tools Discussed/Used     Consult Status Consult Status: Complete    Lynda Rainwater 02/13/2013, 4:25 PM

## 2013-02-13 NOTE — Progress Notes (Signed)
Delivery Note At 11:23 AM a viable female was delivered via Vaginal, Spontaneous Delivery (Presentation: ;  ).  APGAR:8 ,9 ; weight .   Placenta status: Intact, Spontaneous.  Cord:  with the following complications: .  Cord pH: pending  Anesthesia: Epidural  Episiotomy:  Lacerations: none Suture Repair: N/A Est. Blood Loss (mL):   Mom to postpartum.  Baby to nursery-stable.  Jordan Cabrera,Justyna Timoney E 02/13/2013, 11:30 AM

## 2013-02-13 NOTE — H&P (Signed)
Jordan Cabrera is a 42 y.o. female presenting for   IOL at term with favorable cervix and history of rapid labor. No ROM, no HA, no epigastric pain.                                                                                                                                                                                                                                                             Maternal Medical History:  Fetal activity: Perceived fetal activity is normal.      OB History   Grav Para Term Preterm Abortions TAB SAB Ect Mult Living   4 3 1 2  0 0 0 0 0 3     Past Medical History  Diagnosis Date  . Eczema   . AMA (advanced maternal age) multigravida 35+    Past Surgical History  Procedure Laterality Date  . No past surgeries     Family History: family history is not on file. Social History:  reports that she has never smoked. She has never used smokeless tobacco. She reports that she does not drink alcohol or use illicit drugs.   Prenatal Transfer Tool  Maternal Diabetes: No Genetic Screening: Normal Maternal Ultrasounds/Referrals: Normal Fetal Ultrasounds or other Referrals:  None Maternal Substance Abuse:  No Significant Maternal Medications:  None Significant Maternal Lab Results:  None Other Comments:  None  Review of Systems  Eyes: Negative for blurred vision.  Gastrointestinal: Negative for abdominal pain.  Neurological: Negative for headaches.    Dilation: 4 Effacement (%): 90 Station: -2 Exam by:: Tiffancy Moger Blood pressure 97/49, pulse 65, temperature 97.8 F (36.6 C), temperature source Oral, height 5\' 4"  (1.626 m), weight 146 lb (66.225 kg), last menstrual period 04/28/2012. Maternal Exam:  Uterine Assessment: Contraction strength is mild.  Contraction frequency is irregular.   Abdomen: Fetal presentation: vertex     Fetal Exam Fetal State Assessment: Category I - tracings are normal.     Physical Exam  Cardiovascular: Normal rate and  regular rhythm.   Respiratory: Effort normal and breath sounds normal.  GI: Soft. There is no tenderness.  Neurological: She has normal reflexes.   AROM scant AF            + bloody show with gentle  AROM Prenatal labs: ABO, Rh:  A/Positive/-- (03/21 0000) Antibody: Negative (03/21 0000) Rubella: Immune (03/21 0000) RPR: Nonreactive (03/21 0000)  HBsAg: Negative (03/21 0000)  HIV: Non-reactive (03/21 0000)  GBS: Negative (08/20 0000)   Assessment/Plan: 42 yo G4P3 at 54 1/7 weeks D/W patient and husband labor induction/pitocin and risks Begin pitocin augmentation.  Kennon Encinas II,Leobardo Granlund E 02/13/2013, 7:52 AM

## 2013-02-14 LAB — CBC
Hemoglobin: 11.7 g/dL — ABNORMAL LOW (ref 12.0–15.0)
MCHC: 35 g/dL (ref 30.0–36.0)
RDW: 15 % (ref 11.5–15.5)

## 2013-02-14 NOTE — Progress Notes (Signed)
Post Partum Day 1 Subjective: no complaints, up ad lib, voiding and tolerating PO  Objective: Blood pressure 95/60, pulse 64, temperature 98.7 F (37.1 C), temperature source Axillary, resp. rate 18, height 5\' 4"  (1.626 m), weight 146 lb (66.225 kg), last menstrual period 04/28/2012, SpO2 97.00%, unknown if currently breastfeeding.  Physical Exam:  General: alert and cooperative Lochia: appropriate Uterine Fundus: firm Incision: perineum intact DVT Evaluation: No evidence of DVT seen on physical exam. Negative Homan's sign. No cords or calf tenderness. No significant calf/ankle edema.   Recent Labs  02/13/13 0730 02/14/13 0545  HGB 12.3 11.7*  HCT 34.6* 33.4*    Assessment/Plan: Plan for discharge tomorrow and Circumcision prior to discharge   LOS: 1 day   Dontavis Tschantz G 02/14/2013, 8:21 AM

## 2013-02-14 NOTE — Anesthesia Postprocedure Evaluation (Signed)
  Anesthesia Post-op Note  Anesthesia Post Note  Patient: Jordan Cabrera  Procedure(s) Performed: * No procedures listed *  Anesthesia type: Epidural  Patient location: Mother/Baby  Post pain: Pain level controlled  Post assessment: Post-op Vital signs reviewed  Last Vitals:  Filed Vitals:   02/14/13 0617  BP: 95/60  Pulse: 64  Temp: 37.1 C  Resp: 18    Post vital signs: Reviewed  Level of consciousness:alert  Complications: No apparent anesthesia complications

## 2013-02-15 MED ORDER — IBUPROFEN 600 MG PO TABS: 600.0000 mg | ORAL_TABLET | Freq: Four times a day (QID) | ORAL | Status: AC

## 2013-02-15 NOTE — Discharge Summary (Signed)
Obstetric Discharge Summary Reason for Admission: induction of labor Prenatal Procedures: ultrasound Intrapartum Procedures: spontaneous vaginal delivery Postpartum Procedures: none Complications-Operative and Postpartum: none Hemoglobin  Date Value Range Status  02/14/2013 11.7* 12.0 - 15.0 g/dL Final     HCT  Date Value Range Status  02/14/2013 33.4* 36.0 - 46.0 % Final    Physical Exam:  General: alert and cooperative Lochia: appropriate Uterine Fundus: firm Incision: perineum intact DVT Evaluation: No evidence of DVT seen on physical exam. Negative Homan's sign. No cords or calf tenderness. No significant calf/ankle edema.  Discharge Diagnoses: Term Pregnancy-delivered  Discharge Information: Date: 02/15/2013 Activity: pelvic rest Diet: routine Medications: PNV and Ibuprofen Condition: stable Instructions: refer to practice specific booklet Discharge to: home   Newborn Data: Live born female  Birth Weight: 6 lb 10.7 oz (3025 g) APGAR: 8, 9  Home with mother.  Mir Fullilove G 02/15/2013, 8:16 AM

## 2013-10-04 ENCOUNTER — Ambulatory Visit (INDEPENDENT_AMBULATORY_CARE_PROVIDER_SITE_OTHER): Payer: 59 | Admitting: Family Medicine

## 2013-10-04 VITALS — BP 94/62 | HR 92 | Temp 98.1°F | Resp 16 | Ht 63.25 in | Wt 125.8 lb

## 2013-10-04 DIAGNOSIS — J029 Acute pharyngitis, unspecified: Secondary | ICD-10-CM

## 2013-10-04 DIAGNOSIS — R509 Fever, unspecified: Secondary | ICD-10-CM

## 2013-10-04 DIAGNOSIS — H9209 Otalgia, unspecified ear: Secondary | ICD-10-CM

## 2013-10-04 LAB — POCT RAPID STREP A (OFFICE): Rapid Strep A Screen: NEGATIVE

## 2013-10-04 MED ORDER — AMOXICILLIN 875 MG PO TABS: 875.0000 mg | ORAL_TABLET | Freq: Two times a day (BID) | ORAL | Status: AC

## 2013-10-04 NOTE — Progress Notes (Signed)
Chief Complaint:  Chief Complaint  Patient presents with  . Sore Throat    X 3-4 days  . Cough    X 3-4 days    HPI: Jordan Cabrera is a 43 y.o. female who is here for  Sore throat and stuffy nose and cough, little bit of yellow production.  She has 4 children, youngest is 8 months, bottle feeding.  SHe thinks she may have had fevers and chills but did not take anythign for it.  Has swollen gland, + TMJ History of strep, tired nyqul and salt water gargles   Past Medical History  Diagnosis Date  . Eczema   . AMA (advanced maternal age) multigravida 35+    Past Surgical History  Procedure Laterality Date  . No past surgeries     History   Social History  . Marital Status: Divorced    Spouse Name: N/A    Number of Children: N/A  . Years of Education: N/A   Social History Main Topics  . Smoking status: Never Smoker   . Smokeless tobacco: Never Used  . Alcohol Use: No  . Drug Use: No  . Sexual Activity: Not Currently    Birth Control/ Protection: None   Other Topics Concern  . None   Social History Narrative  . None   History reviewed. No pertinent family history. No Known Allergies Prior to Admission medications   Medication Sig Start Date End Date Taking? Authorizing Provider  Pseudoeph-Doxylamine-DM-APAP (NYQUIL PO) Take by mouth.   Yes Historical Provider, MD  ibuprofen (ADVIL,MOTRIN) 600 MG tablet Take 1 tablet (600 mg total) by mouth every 6 (six) hours. 02/15/13   Judith Blonderarol G Curtis, NP  Prenatal Vit-Fe Fumarate-FA (PRENATAL MULTIVITAMIN) TABS Take 1 tablet by mouth every evening.     Historical Provider, MD     ROS: The patient denies  night sweats, unintentional weight loss, chest pain, palpitations, wheezing, dyspnea on exertion, nausea, vomiting, abdominal pain, dysuria, hematuria, melena, numbness, weakness, or tingling.   All other systems have been reviewed and were otherwise negative with the exception of those mentioned in the HPI and as above.      PHYSICAL EXAM: Filed Vitals:   10/04/13 0850  BP: 94/62  Pulse: 92  Temp: 98.1 F (36.7 C)  Resp: 16   Filed Vitals:   10/04/13 0850  Height: 5' 3.25" (1.607 m)  Weight: 125 lb 12.8 oz (57.063 kg)   Body mass index is 22.1 kg/(m^2).  General: Alert, no acute distress HEENT:  Normocephalic, atraumatic, oropharynx patent. EOMI, PERRLA, TM nl, erythmatous  throat, NO exudates, Nontender sinus Cardiovascular:  Regular rate and rhythm, no rubs murmurs or gallops.  No Carotid bruits, radial pulse intact. No pedal edema.  Respiratory: Clear to auscultation bilaterally.  No wheezes, rales, or rhonchi.  No cyanosis, no use of accessory musculature GI: No organomegaly, abdomen is soft and non-tender, positive bowel sounds.  No masses. Skin: No rashes. Neurologic: Facial musculature symmetric. Psychiatric: Patient is appropriate throughout our interaction. Lymphatic: + small cervical lymphadenopathy Musculoskeletal: Gait intact.   LABS: Results for orders placed in visit on 10/04/13  POCT RAPID STREP A (OFFICE)      Result Value Ref Range   Rapid Strep A Screen Negative  Negative     EKG/XRAY:   [facility]   ASSESSMENT/PLAN: Encounter Diagnoses  Name Primary?  . Sore throat Yes  . Chills with fever   . Otalgia  Rx amoxacillin Throat cx pending F/u prn  Gross sideeffects, risk and benefits, and alternatives of medications d/w patient. Patient is aware that all medications have potential sideeffects and we are unable to predict every sideeffect or drug-drug interaction that may occur.  Lenell Antuhao P Mishal Probert, DO 10/04/2013 9:33 AM

## 2013-10-06 LAB — CULTURE, GROUP A STREP: Organism ID, Bacteria: NORMAL

## 2013-10-10 ENCOUNTER — Encounter: Payer: Self-pay | Admitting: *Deleted

## 2013-12-14 ENCOUNTER — Other Ambulatory Visit: Payer: Self-pay | Admitting: Family Medicine

## 2013-12-14 DIAGNOSIS — N644 Mastodynia: Secondary | ICD-10-CM

## 2013-12-14 DIAGNOSIS — N61 Mastitis without abscess: Secondary | ICD-10-CM

## 2013-12-21 ENCOUNTER — Other Ambulatory Visit: Payer: BC Managed Care – PPO

## 2013-12-26 ENCOUNTER — Other Ambulatory Visit: Payer: Self-pay | Admitting: Plastic Surgery

## 2014-12-25 ENCOUNTER — Other Ambulatory Visit: Payer: Self-pay | Admitting: Family Medicine

## 2014-12-25 ENCOUNTER — Other Ambulatory Visit (HOSPITAL_COMMUNITY)
Admission: RE | Admit: 2014-12-25 | Discharge: 2014-12-25 | Disposition: A | Discharge status: Home / Self Care | Payer: 59 | Source: Ambulatory Visit | Attending: Family Medicine | Admitting: Family Medicine

## 2014-12-25 DIAGNOSIS — Z01411 Encounter for gynecological examination (general) (routine) with abnormal findings: Secondary | ICD-10-CM | POA: Diagnosis not present

## 2014-12-31 LAB — CYTOLOGY - PAP

## 2015-01-13 IMAGING — US US OB COMP LESS 14 WK
1 series · 13 of 28 positions shown · non-contrast
Comparison: none

[Series 1: us ob comp less 14 wks · 13 of 28 slices shown]
[im 2/28]
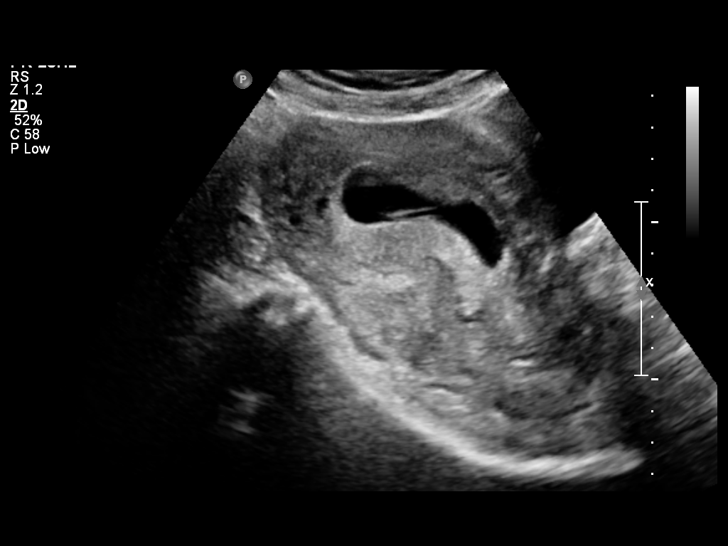
[im 4/28]
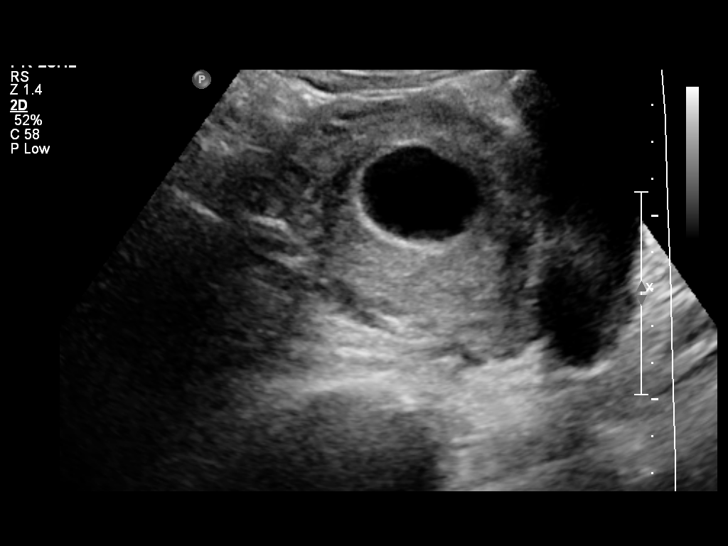
[im 6/28]
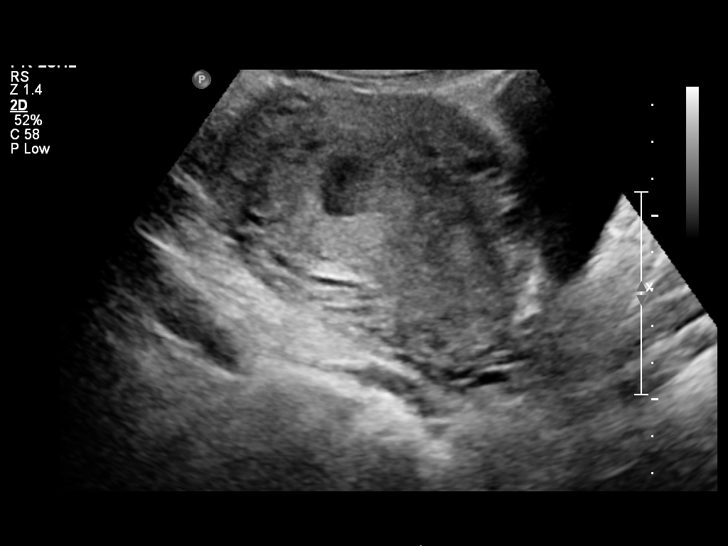
[im 8/28]
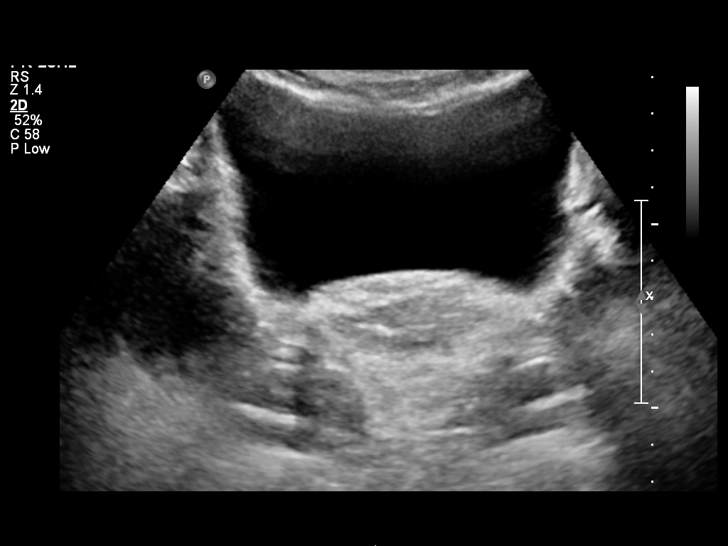
[im 10/28]
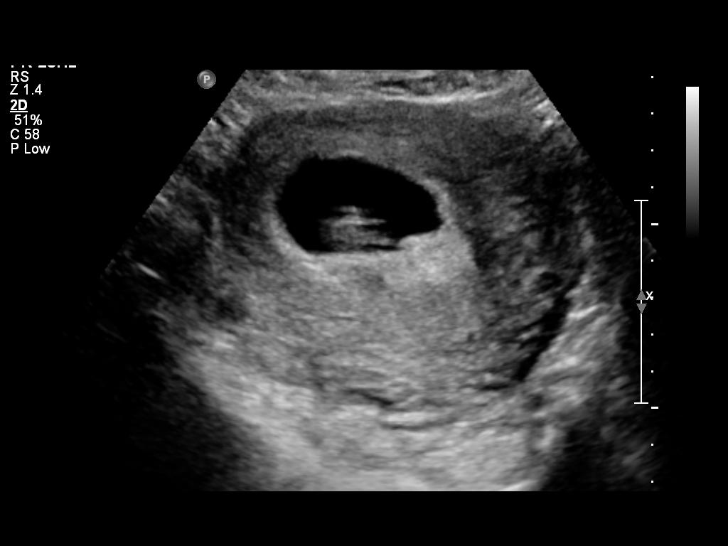
[im 12/28]
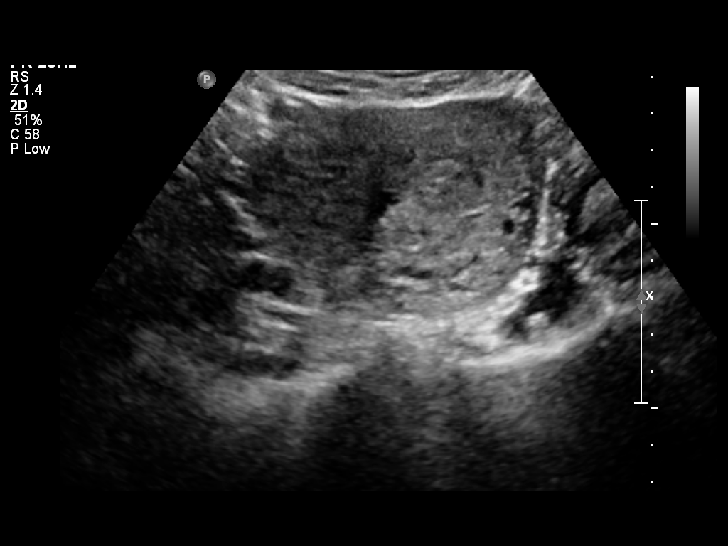
[im 15/28]
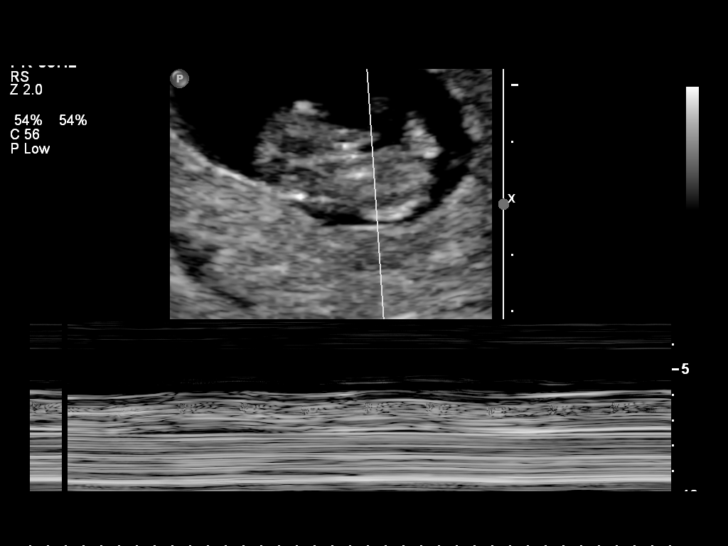
[im 17/28]
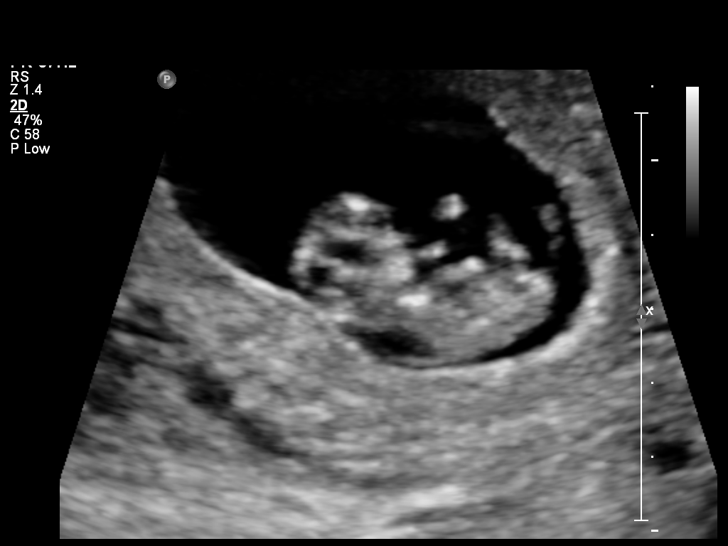
[im 19/28]
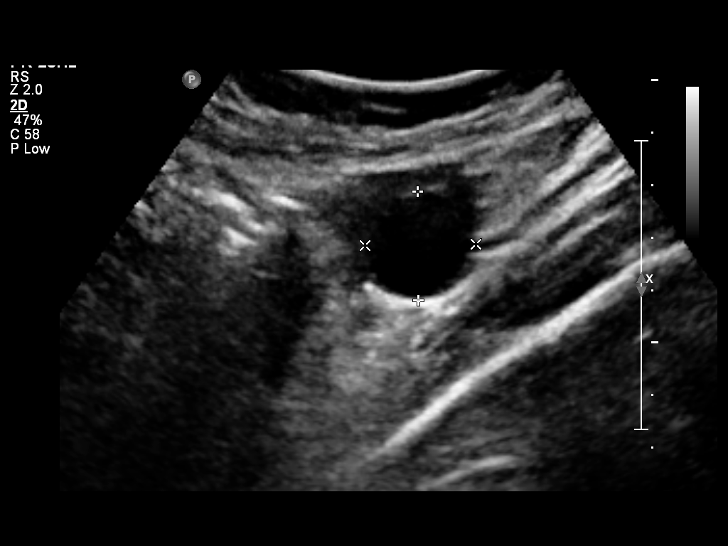
[im 21/28]
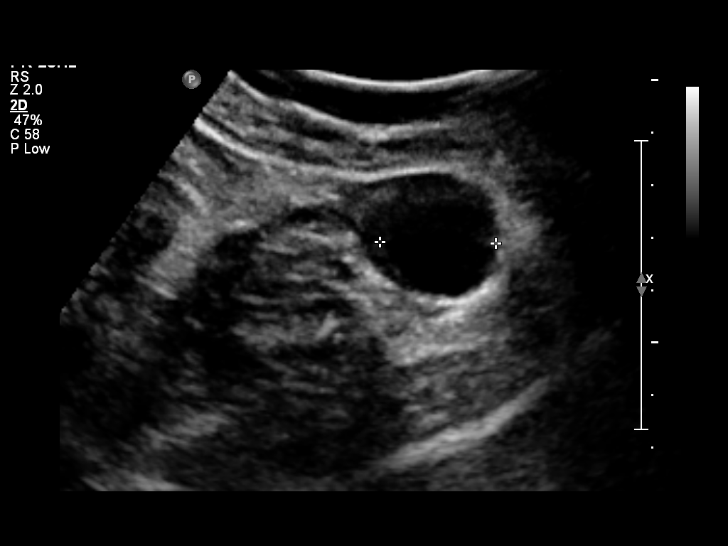
[im 23/28]
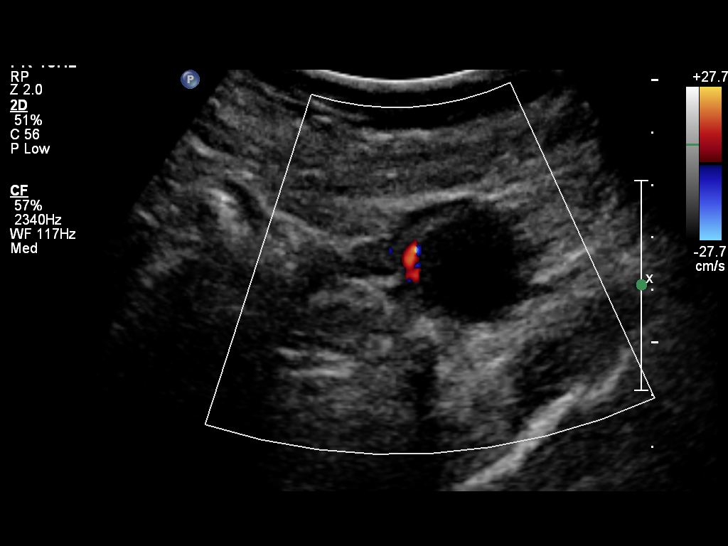
[im 25/28]
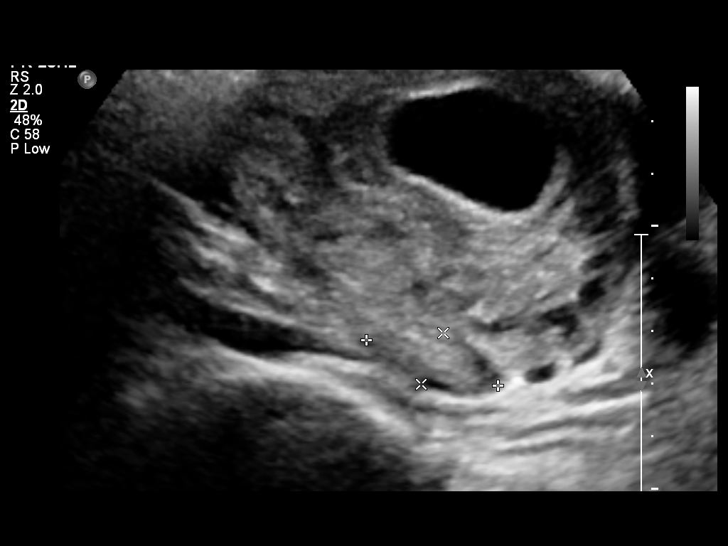
[im 27/28]
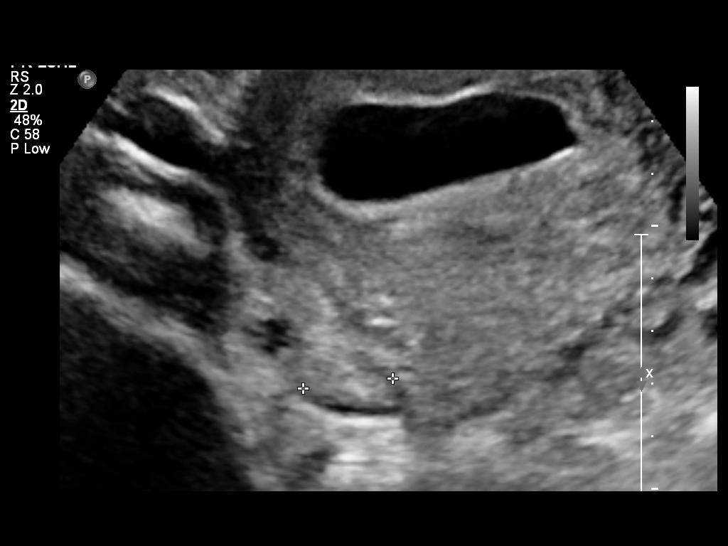

[13 of 28 positions shown; findings below may reference images not displayed]

OBSTETRICS REPORT
                      (Signed Final 07/26/2012 [DATE])

Service(s) Provided

 US OB COMP LESS 14 WKS                                76801.0
Indications

 Vaginal bleeding, unknown etiology
Fetal Evaluation

 Num Of Fetuses:    1
 Gest. Sac:         Intrauterine
 Yolk Sac:          Not visualized
 Fetal Pole:        Visualized
 Fetal Heart Rate:  163                         bpm
 Cardiac Activity:  Observed

 Amniotic Fluid
 AFI FV:      Subjectively within normal limits
Biometry

 CRL:     35.6  mm    G. Age:   10w 2d                 EDD:   02/19/13
Gestational Age

 LMP:           12w 5d       Date:   04/28/12                 EDD:   02/02/13
 Best:          10w 2d    Det. By:   U/S C R L (07/26/12)     EDD:   02/19/13
Anatomy

 Other:  Technically difficult due to early GA.
Cervix Uterus Adnexa

 Uterus:       No abnormality visualized.
 Cul De Sac:   No free fluid seen.

 Left Ovary:   Size(cm) L: 3.59 x H:
 Right Ovary:  Size(cm) L: 2.64 x W: 1.71 x H: 1.06  Volume(cc):
               2.5. Simple cyst, measuring 4.4x4.1x4.1 cm
Impression

 Intrauterine gestational sac, fetal pole, and cardiac activity
 noted.  Suggest best dating of 85w7d by today's exam.
 No acute abnormality.
 questions or concerns.

## 2018-02-02 ENCOUNTER — Other Ambulatory Visit (HOSPITAL_COMMUNITY)
Admission: RE | Admit: 2018-02-02 | Discharge: 2018-02-02 | Disposition: A | Discharge status: Home / Self Care | Payer: BLUE CROSS/BLUE SHIELD | Source: Ambulatory Visit | Attending: Family Medicine | Admitting: Family Medicine

## 2018-02-02 ENCOUNTER — Other Ambulatory Visit: Payer: Self-pay | Admitting: Family Medicine

## 2018-02-02 DIAGNOSIS — Z01411 Encounter for gynecological examination (general) (routine) with abnormal findings: Secondary | ICD-10-CM | POA: Insufficient documentation

## 2018-02-04 LAB — CYTOLOGY - PAP
Diagnosis: NEGATIVE
HPV (WINDOPATH): NOT DETECTED

## 2019-08-23 ENCOUNTER — Ambulatory Visit: Payer: BLUE CROSS/BLUE SHIELD | Attending: Internal Medicine

## 2019-08-23 DIAGNOSIS — Z23 Encounter for immunization: Secondary | ICD-10-CM

## 2019-08-23 NOTE — Progress Notes (Signed)
   Covid-19 Vaccination Clinic  Name:  Jordan Cabrera    MRN: [id number] DOB: 18-Oct-1970  08/23/2019  Jordan Cabrera was observed post Covid-19 immunization for 15 minutes without incident. She was provided with Vaccine Information Sheet and instruction to access the V-Safe system.   Jordan Cabrera was instructed to call 911 with any severe reactions post vaccine: Marland Kitchen Difficulty breathing  . Swelling of face and throat  . A fast heartbeat  . A bad rash all over body  . Dizziness and weakness   Immunizations Administered    Name Date Dose VIS Date Route   Pfizer COVID-19 Vaccine 08/23/2019  9:09 AM 0.3 mL 05/12/2019 Intramuscular   Manufacturer: [employer], [employer]   Lot: [id number]   NDC: [address]-1

## 2019-09-13 ENCOUNTER — Ambulatory Visit: Payer: BLUE CROSS/BLUE SHIELD | Attending: Internal Medicine

## 2019-09-13 DIAGNOSIS — Z23 Encounter for immunization: Secondary | ICD-10-CM

## 2019-09-13 NOTE — Progress Notes (Signed)
   Covid-19 Vaccination Clinic  Name:  Jordan Cabrera    MRN: [id number] DOB: [birth year]  09/13/2019  [name] was observed post Covid-19 immunization for 15 minutes without incident. She was provided with Vaccine Information Sheet and instruction to access the V-Safe system.   [name] was instructed to call 911 with any severe reactions post vaccine: Marland Kitchen Difficulty breathing  . Swelling of face and throat  . A fast heartbeat  . A bad rash all over body  . Dizziness and weakness   Immunizations Administered    Name Date Dose VIS Date Route   Pfizer COVID-19 Vaccine 09/13/2019 12:16 PM 0.3 mL 05/12/2019 Intramuscular   Manufacturer: [employer], [employer]   Lot: [id number]   NDC: [address]-6

## 2022-06-24 DIAGNOSIS — Z1231 Encounter for screening mammogram for malignant neoplasm of breast: Secondary | ICD-10-CM | POA: Diagnosis not present

## 2022-10-06 DIAGNOSIS — Z0000 Encounter for general adult medical examination without abnormal findings: Secondary | ICD-10-CM | POA: Diagnosis not present

## 2022-10-06 DIAGNOSIS — Z13220 Encounter for screening for lipoid disorders: Secondary | ICD-10-CM | POA: Diagnosis not present

## 2022-10-07 ENCOUNTER — Other Ambulatory Visit (HOSPITAL_COMMUNITY)
Admission: RE | Admit: 2022-10-07 | Discharge: 2022-10-07 | Disposition: A | Discharge status: Home / Self Care | Payer: 59 | Source: Ambulatory Visit | Attending: Family Medicine | Admitting: Family Medicine

## 2022-10-07 ENCOUNTER — Other Ambulatory Visit: Payer: Self-pay | Admitting: Family Medicine

## 2022-10-07 DIAGNOSIS — Z01411 Encounter for gynecological examination (general) (routine) with abnormal findings: Secondary | ICD-10-CM | POA: Insufficient documentation

## 2022-10-07 DIAGNOSIS — E559 Vitamin D deficiency, unspecified: Secondary | ICD-10-CM | POA: Diagnosis not present

## 2022-10-07 DIAGNOSIS — D259 Leiomyoma of uterus, unspecified: Secondary | ICD-10-CM | POA: Diagnosis not present

## 2022-10-07 DIAGNOSIS — Z0000 Encounter for general adult medical examination without abnormal findings: Secondary | ICD-10-CM | POA: Diagnosis not present

## 2022-10-07 DIAGNOSIS — T8549XA Other mechanical complication of breast prosthesis and implant, initial encounter: Secondary | ICD-10-CM | POA: Diagnosis not present

## 2022-10-12 LAB — CYTOLOGY - PAP
Comment: NEGATIVE
Diagnosis: NEGATIVE
High risk HPV: NEGATIVE

## 2022-11-04 ENCOUNTER — Ambulatory Visit
Admission: RE | Admit: 2022-11-04 | Discharge: 2022-11-04 | Disposition: A | Discharge status: Home / Self Care | Payer: 59 | Source: Ambulatory Visit | Attending: Family Medicine | Admitting: Family Medicine

## 2022-11-04 DIAGNOSIS — D259 Leiomyoma of uterus, unspecified: Secondary | ICD-10-CM

## 2022-11-04 DIAGNOSIS — D252 Subserosal leiomyoma of uterus: Secondary | ICD-10-CM | POA: Diagnosis not present
